# Patient Record
Sex: Male | Born: 1960 | Race: White | Hispanic: No | Marital: Married
Health system: Southern US, Community
[De-identification: ages and names within clinical notes are randomized; demographics above are authoritative.]

---

## 2019-04-23 ENCOUNTER — Emergency Department: Payer: 59

## 2019-04-23 ENCOUNTER — Other Ambulatory Visit: Payer: Self-pay

## 2019-04-23 ENCOUNTER — Encounter: Payer: Self-pay | Admitting: Emergency Medicine

## 2019-04-23 ENCOUNTER — Emergency Department
Admission: EM | Admit: 2019-04-23 | Discharge: 2019-04-23 | Disposition: A | Discharge status: Home / Self Care | Payer: 59 | Attending: Emergency Medicine | Admitting: Emergency Medicine

## 2019-04-23 DIAGNOSIS — S0990XA Unspecified injury of head, initial encounter: Secondary | ICD-10-CM

## 2019-04-23 DIAGNOSIS — M25551 Pain in right hip: Secondary | ICD-10-CM | POA: Diagnosis not present

## 2019-04-23 DIAGNOSIS — Y999 Unspecified external cause status: Secondary | ICD-10-CM | POA: Insufficient documentation

## 2019-04-23 DIAGNOSIS — Y939 Activity, unspecified: Secondary | ICD-10-CM | POA: Diagnosis not present

## 2019-04-23 DIAGNOSIS — Z79899 Other long term (current) drug therapy: Secondary | ICD-10-CM | POA: Insufficient documentation

## 2019-04-23 DIAGNOSIS — Y92410 Unspecified street and highway as the place of occurrence of the external cause: Secondary | ICD-10-CM | POA: Diagnosis not present

## 2019-04-23 MED ORDER — CYCLOBENZAPRINE HCL 10 MG PO TABS: 10.0000 mg | ORAL_TABLET | Freq: Three times a day (TID) | ORAL | 0 refills | Status: AC | PRN

## 2019-04-23 MED ORDER — TRAMADOL HCL 50 MG PO TABS
50.0000 mg | ORAL_TABLET | Freq: Once | ORAL | Status: AC
  Administered 2019-04-23: 50 mg via ORAL
  Filled 2019-04-23: qty 1

## 2019-04-23 MED ORDER — IBUPROFEN 600 MG PO TABS: 600.0000 mg | ORAL_TABLET | Freq: Three times a day (TID) | ORAL | 0 refills | Status: AC | PRN

## 2019-04-23 MED ORDER — IBUPROFEN 600 MG PO TABS
600.0000 mg | ORAL_TABLET | Freq: Once | ORAL | Status: AC
  Administered 2019-04-23: 600 mg via ORAL
  Filled 2019-04-23: qty 1

## 2019-04-23 MED ORDER — CYCLOBENZAPRINE HCL 10 MG PO TABS
10.0000 mg | ORAL_TABLET | Freq: Once | ORAL | Status: AC
  Administered 2019-04-23: 10 mg via ORAL
  Filled 2019-04-23: qty 1
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
## 2019-04-23 NOTE — Discharge Instructions (Signed)
Follow discharge care instruction take medication as directed. °
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
## 2022-06-24 ENCOUNTER — Ambulatory Visit
Admission: EM | Admit: 2022-06-24 | Discharge: 2022-06-24 | Disposition: A | Discharge status: Home / Self Care | Payer: BC Managed Care – PPO | Attending: Urgent Care | Admitting: Urgent Care

## 2022-06-24 DIAGNOSIS — R6889 Other general symptoms and signs: Secondary | ICD-10-CM | POA: Diagnosis not present

## 2022-06-24 MED ORDER — BENZONATATE 100 MG PO CAPS: ORAL_CAPSULE | ORAL | 0 refills | Status: AC

## 2022-06-24 NOTE — ED Provider Notes (Signed)
Jeffrey Ryan    CSN: [id number] Arrival date & time: 06/24/22  0801      History   Chief Complaint No chief complaint on file.   HPI Jeffrey Ryan is a 61 y.o. male.   HPI  Presents to UC with concern for chills and body aches since Friday (4 days). Pt suspects flu though no positive diagnosis. He states he is concerned about possibility of virus moving to his lungs because he has been coughing so hard.  He feels "burning" at the center of his chest. Endorses still having fevers though he says the fevers are becoming lower and lower grade temperatures. And using alternating tylenol and ibuprofen to control his symptoms.  No past medical history on file.  There are no problems to display for this patient.   No past surgical history on file.     Home Medications    Prior to Admission medications   Medication Sig Start Date End Date Taking? Authorizing Provider  carvedilol (COREG) 25 MG tablet Take 25 mg by mouth 2 (two) times daily with a meal.    [provider]  cyclobenzaprine (FLEXERIL) 10 MG tablet Take 1 tablet (10 mg total) by mouth 3 (three) times daily as needed. 04/23/19   Joni Reining, PA-C  desvenlafaxine (PRISTIQ) 100 MG 24 hr tablet Take 100 mg by mouth daily.    [provider]  ibuprofen (ADVIL) 600 MG tablet Take 1 tablet (600 mg total) by mouth every 8 (eight) hours as needed. 04/23/19   Joni Reining, PA-C  lisdexamfetamine (VYVANSE) 70 MG capsule Take 70 mg by mouth daily.    [provider]  rosuvastatin (CRESTOR) 10 MG tablet Take 10 mg by mouth daily.    [provider]  valsartan (DIOVAN) 40 MG tablet Take 40 mg by mouth daily.    [provider]    Family History No family history on file.  Social History     Allergies   Patient has no known allergies.   Review of Systems Review of Systems   Physical Exam Triage Vital Signs ED Triage Vitals  Enc Vitals Group     BP       Pulse      Resp      Temp      Temp src      SpO2      Weight      Height      Head Circumference      Peak Flow      Pain Score      Pain Loc      Pain Edu?      Excl. in GC?    No data found.  Updated Vital Signs There were no vitals taken for this visit.  Visual Acuity Right Eye Distance:   Left Eye Distance:   Bilateral Distance:    Right Eye Near:   Left Eye Near:    Bilateral Near:     Physical Exam Vitals reviewed.  Constitutional:      Appearance: He is ill-appearing.  HENT:     Right Ear: Tympanic membrane normal.     Left Ear: Tympanic membrane normal.     Mouth/Throat:     Pharynx: No oropharyngeal exudate or posterior oropharyngeal erythema.  Cardiovascular:     Rate and Rhythm: Normal rate and regular rhythm.     Pulses: Normal pulses.     Heart sounds: Normal heart sounds.  Pulmonary:  Effort: Pulmonary effort is normal.     Breath sounds: Normal breath sounds.  Skin:    General: Skin is warm and dry.  Neurological:     General: No focal deficit present.     Mental Status: He is alert and oriented to person, place, and time.  Psychiatric:        Mood and Affect: Mood normal.      UC Treatments / Results  Labs (all labs ordered are listed, but only abnormal results are displayed) Labs Reviewed - No data to display  EKG   Radiology No results found.  Procedures Procedures (including critical care time)  Medications Ordered in UC Medications - No data to display  Initial Impression / Assessment and Plan / UC Course  I have reviewed the triage vital signs and the nursing notes.  Pertinent labs & imaging results that were available during my care of the patient were reviewed by me and considered in my medical decision making (see chart for details).   Patient is afebrile here without recent antipyretics. Satting well on room air. Overall is ill appearing, though well hydrated and without respiratory distress. Pulmonary exam is  unremarkable.  Lungs CTAB without wheezes, rhonchi, rales.  TMs WNL bilaterally.  Throat is mildly erythematous without presence of peritonsillar exudates.  Suspect viral process including influenza, though after 4 days he is at the tail end of this illness.  No recommendation for testing today as he is outside of the window of antiviral treatment.  Will prescribe benzonatate to help control his cough.  Also recommending OTC medications including guaifenesin for symptom control.  Final Clinical Impressions(s) / UC Diagnoses   Final diagnoses:  None   Discharge Instructions   None    ED Prescriptions   None    PDMP not reviewed this encounter.   Charma Igo, [place] 06/24/22 [phone]

## 2022-06-24 NOTE — ED Triage Notes (Signed)
Pt. Presents to UC w/ c/o chills and body aches. Pt. States he suspects the FLU but is mainly concerned about the virus moving into his lungs d/t an excessive amount of coughing.

## 2022-06-24 NOTE — Discharge Instructions (Addendum)
You have been diagnosed with a viral upper respiratory infection based on your symptoms and exam. Viral illnesses cannot be treated with antibiotics - they are self limiting - and you should find your symptoms resolving within a few days. Get plenty of rest and non-caffeinated fluids.  We have not performed a respiratory swab checking for COVID or influenza as you are already outside of the window for treatment with antiviral therapy.  I have prescribed benzonatate, a medication that should help control your cough.  I also recommend that you purchase guaifenesin, a medication that will help thin your mucus secretions making it easier for your body to expel them, preventing secondary bacterial infection.  We recommend you use over-the-counter medications for symptom control including Tylenol or ibuprofen for fever, chills or body aches. You may also use cold/cough medication. Some medications are in multiple combo preparations. Please read the active ingredients of the medications you use to prevent taking more than recommended.   Saline mist spray is helpful for removing excess mucus from your nose.  Room humidifiers are helpful to ease breathing at night. You might also find relief of nasal/sinus congestion symptoms by using a nasal decongestant such as Sudafed sinus (pseudoephedrine).  You will need to obtain this medication from behind the pharmacist counter.   Follow up here or with your primary care provider if your symptoms are worsening or not improving.
